# Patient Record
Sex: Female | Born: 1975 | Race: White | Hispanic: No | Marital: Single | Smoking: Current every day smoker
Health system: Southern US, Community
[De-identification: ages and names within clinical notes are randomized; demographics above are authoritative.]

## PROBLEM LIST (undated history)

## (undated) DIAGNOSIS — I10 Essential (primary) hypertension: Secondary | ICD-10-CM

## (undated) DIAGNOSIS — E119 Type 2 diabetes mellitus without complications: Secondary | ICD-10-CM

## (undated) DIAGNOSIS — E785 Hyperlipidemia, unspecified: Secondary | ICD-10-CM

## (undated) DIAGNOSIS — J45909 Unspecified asthma, uncomplicated: Secondary | ICD-10-CM

## (undated) HISTORY — PX: TUBAL LIGATION: SHX77

---

## 2008-11-20 ENCOUNTER — Ambulatory Visit: Payer: Self-pay | Admitting: Family Medicine

## 2009-01-01 ENCOUNTER — Observation Stay: Payer: Self-pay | Admitting: Unknown Physician Specialty

## 2009-01-04 ENCOUNTER — Inpatient Hospital Stay: Payer: Self-pay | Admitting: Obstetrics & Gynecology

## 2010-03-03 ENCOUNTER — Ambulatory Visit: Payer: Self-pay | Admitting: Internal Medicine

## 2010-05-09 ENCOUNTER — Ambulatory Visit: Payer: Self-pay | Admitting: Internal Medicine

## 2010-05-13 ENCOUNTER — Ambulatory Visit: Payer: Self-pay

## 2010-05-19 ENCOUNTER — Ambulatory Visit: Payer: Self-pay

## 2011-01-18 ENCOUNTER — Emergency Department: Payer: Self-pay | Admitting: Emergency Medicine

## 2012-01-30 ENCOUNTER — Ambulatory Visit: Payer: Self-pay | Admitting: Internal Medicine

## 2012-06-10 ENCOUNTER — Ambulatory Visit: Payer: Self-pay | Admitting: Family Medicine

## 2012-06-16 ENCOUNTER — Emergency Department: Payer: Self-pay | Admitting: Emergency Medicine

## 2012-06-20 LAB — WOUND CULTURE

## 2012-07-23 ENCOUNTER — Ambulatory Visit: Payer: Self-pay | Admitting: Family Medicine

## 2013-05-27 ENCOUNTER — Emergency Department: Payer: Self-pay | Admitting: Emergency Medicine

## 2013-05-27 LAB — COMPREHENSIVE METABOLIC PANEL
Albumin: 3.8 g/dL (ref 3.4–5.0)
BUN: 11 mg/dL (ref 7–18)
Bilirubin,Total: 0.4 mg/dL (ref 0.2–1.0)
Calcium, Total: 8.9 mg/dL (ref 8.5–10.1)
Co2: 27 mmol/L (ref 21–32)
Creatinine: 0.75 mg/dL (ref 0.60–1.30)
EGFR (African American): >60
EGFR (Non-African Amer.): >60
Glucose: 196 mg/dL — ABNORMAL HIGH (ref 65–99)
Potassium: 3.9 mmol/L (ref 3.5–5.1)
SGPT (ALT): 26 U/L (ref 12–78)

## 2013-05-27 LAB — CBC
MCH: 30.7 pg (ref 26.0–34.0)
MCHC: 33.4 g/dL (ref 32.0–36.0)
MCV: 92 fL (ref 80–100)
Platelet: 294 10*3/uL (ref 150–440)
RBC: 4.47 10*6/uL (ref 3.80–5.20)
RDW: 14.2 % (ref 11.5–14.5)

## 2013-05-27 LAB — URINALYSIS, COMPLETE
Bilirubin,UR: NEGATIVE
Blood: NEGATIVE
Glucose,UR: NEGATIVE mg/dL (ref 0–75)
Ketone: NEGATIVE
Nitrite: NEGATIVE
Ph: 5 (ref 4.5–8.0)
Protein: 30
RBC,UR: 2 /HPF (ref 0–5)

## 2013-05-28 LAB — URINE CULTURE

## 2014-03-23 ENCOUNTER — Ambulatory Visit: Payer: Self-pay | Admitting: Physician Assistant

## 2019-01-27 ENCOUNTER — Other Ambulatory Visit: Payer: Self-pay

## 2019-01-27 ENCOUNTER — Ambulatory Visit
Admission: EM | Admit: 2019-01-27 | Discharge: 2019-01-27 | Disposition: A | Discharge status: Home / Self Care | Payer: Medicaid Other | Attending: Family Medicine | Admitting: Family Medicine

## 2019-01-27 DIAGNOSIS — S161XXA Strain of muscle, fascia and tendon at neck level, initial encounter: Secondary | ICD-10-CM

## 2019-01-27 DIAGNOSIS — S29019A Strain of muscle and tendon of unspecified wall of thorax, initial encounter: Secondary | ICD-10-CM | POA: Diagnosis not present

## 2019-01-27 HISTORY — DX: Essential (primary) hypertension: I10

## 2019-01-27 HISTORY — DX: Hyperlipidemia, unspecified: E78.5

## 2019-01-27 HISTORY — DX: Unspecified asthma, uncomplicated: J45.909

## 2019-01-27 HISTORY — DX: Type 2 diabetes mellitus without complications: E11.9

## 2019-01-27 MED ORDER — MELOXICAM 15 MG PO TABS: 15.0000 mg | ORAL_TABLET | Freq: Every day | ORAL | 0 refills | Status: DC

## 2019-01-27 MED ORDER — CYCLOBENZAPRINE HCL 10 MG PO TABS: 10.0000 mg | ORAL_TABLET | Freq: Every day | ORAL | 0 refills | Status: AC

## 2019-01-27 NOTE — ED Triage Notes (Signed)
Patient complains that severe mid back pain and neck pain. Patient states that this started several weeks ago without a known injury.

## 2019-01-27 NOTE — ED Provider Notes (Signed)
[facility]    CSN: [id number] Arrival date & time: 01/27/19  1030     History   Chief Complaint Chief Complaint  Patient presents with  . Neck Pain  . Back Pain    HPI Brittany Mayer is a 43 y.o. female.   43 yo female with a c/o 2-3 weeks of neck and mid back pain. Denies any falls or injuries, fevers, chills, rash.   The history is provided by the patient.  Neck Pain  Back Pain    Past Medical History:  Diagnosis Date  . Asthma   . Diabetes ([place])   . Hyperlipidemia   . Hypertension     There are no active problems to display for this patient.   Past Surgical History:  Procedure Laterality Date  . TUBAL LIGATION      OB History   No obstetric history on file.      Home Medications    Prior to Admission medications   Medication Sig Start Date End Date Taking? Authorizing Provider  beclomethasone (QVAR) 80 MCG/ACT inhaler Inhale into the lungs. 03/30/17  Yes [provider]  cetirizine (ZYRTEC) 10 MG tablet Take 10 mg by mouth daily. 01/09/19  Yes [provider]  citalopram (CELEXA) 20 MG tablet TAKE 1 TABLET (20 MG TOTAL) BY MOUTH ONCE DAILY. 11/20/18  Yes [provider]  esomeprazole (NEXIUM) 20 MG capsule Take by mouth daily. 01/09/19  Yes [provider]  JARDIANCE 25 MG TABS tablet Take 25 mg by mouth daily. 12/09/18  Yes [provider]  LANTUS SOLOSTAR 100 UNIT/ML Solostar Pen INJECT 120 UNITS SUBCUTANEOUSLY NIGHTLY 01/09/19  Yes [provider]  LORazepam (ATIVAN) 0.5 MG tablet TAKE 1 TABLET ( 0.5 MG TOTAL) BY MOUTH EVERY 8 (EIGHT) HOURS AS NEEDED FOR ANXIETY 01/19/19  Yes [provider]  losartan (COZAAR) 100 MG tablet Take 100 mg by mouth daily. 11/07/18  Yes [provider]  metFORMIN (GLUCOPHAGE) 1000 MG tablet TAKE 1 TABLET (1,000 MG TOTAL) BY MOUTH 2 (TWO) TIMES DAILY WITH MEALS. 11/07/18  Yes [provider]  NOVOLOG FLEXPEN 100 UNIT/ML FlexPen INJECT 20  UNITS IN THE AM AND 40 UNITS IN THE PM 01/16/19  Yes [provider]  simvastatin (ZOCOR) 40 MG tablet TAKE 1 TABLET (40 MG TOTAL) BY MOUTH NIGHTLY. 11/20/18  Yes [provider]  SUMAtriptan (IMITREX) 50 MG tablet TAKE 1 TABLET (50 MG TOTAL) BY MOUTH ONCE AS NEEDED FOR MIGRAINE 01/09/19  Yes [provider]  traZODone (DESYREL) 50 MG tablet TAKE 1 TABLET BY MOUTH EVERY DAY AT NIGHT 01/09/19  Yes [provider]  VICTOZA 18 MG/3ML SOPN INJECT 1.8 MG UNDER THE SKIN ONCE DAILY 01/09/19  Yes [provider]  cyclobenzaprine (FLEXERIL) 10 MG tablet Take 1 tablet (10 mg total) by mouth at bedtime. 01/27/19   Norval Gable, MD  meloxicam (MOBIC) 15 MG tablet Take 1 tablet (15 mg total) by mouth daily. 01/27/19   Norval Gable, MD    Family History Family History  Problem Relation Age of Onset  . Hypertension Mother   . Hyperlipidemia Mother   . Asthma Mother   . Heart disease Father     Social History Social History   Tobacco Use  . Smoking status: Current Every Day Smoker    Packs/day: 1.00    Types: Cigarettes  . Smokeless tobacco: Never Used  Substance Use Topics  . Alcohol use: Not Currently  . Drug use: Never  Allergies   Patient has no known allergies.   Review of Systems Review of Systems  Musculoskeletal: Positive for back pain and neck pain.     Physical Exam Triage Vital Signs ED Triage Vitals  Enc Vitals Group     BP 01/27/19 1103 (!) 109/57     Pulse Rate 01/27/19 1103 93     Resp 01/27/19 1103 18     Temp 01/27/19 1103 98.7 F (37.1 C)     Temp Source 01/27/19 1103 Oral     SpO2 01/27/19 1103 96 %     Weight 01/27/19 1058 300 lb (136.1 kg)     Height 01/27/19 1058 5' 5"  (1.651 m)     Head Circumference --      Peak Flow --      Pain Score 01/27/19 1058 8     Pain Loc --      Pain Edu? --      Excl. in [place]? --    No data found.  Updated Vital Signs BP (!) 109/57 (BP Location: Right Arm)   Pulse 93   Temp  98.7 F (37.1 C) (Oral)   Resp 18   Ht 5' 5"  (1.651 m)   Wt 136.1 kg   LMP 01/23/2019   SpO2 96%   BMI 49.92 kg/m   Visual Acuity Right Eye Distance:   Left Eye Distance:   Bilateral Distance:    Right Eye Near:   Left Eye Near:    Bilateral Near:     Physical Exam Vitals signs and nursing note reviewed.  Constitutional:      General: She is not in acute distress.    Appearance: She is well-developed. She is not diaphoretic.  HENT:     Head: Normocephalic and atraumatic.  Eyes:     Pupils: Pupils are equal, round, and reactive to light.  Neck:     Musculoskeletal: Normal range of motion and neck supple.     Thyroid: No thyromegaly.     Trachea: No tracheal deviation.  Musculoskeletal:     Cervical back: She exhibits tenderness (paraspinous muscles) and spasm. She exhibits normal range of motion, no bony tenderness, no swelling, no edema, no deformity, no laceration and normal pulse.     Thoracic back: She exhibits tenderness (paraspinous muscles) and spasm. She exhibits normal range of motion and no bony tenderness.  Lymphadenopathy:     Cervical: No cervical adenopathy.  Neurological:     Mental Status: She is alert and oriented to person, place, and time.     Cranial Nerves: No cranial nerve deficit.     Motor: No abnormal muscle tone.     Coordination: Coordination normal.     Deep Tendon Reflexes: Reflexes are normal and symmetric.      UC Treatments / Results  Labs (all labs ordered are listed, but only abnormal results are displayed) Labs Reviewed - No data to display  EKG None  Radiology No results found.  Procedures Procedures (including critical care time)  Medications Ordered in UC Medications - No data to display  Initial Impression / Assessment and Plan / UC Course  I have reviewed the triage vital signs and the nursing notes.  Pertinent labs & imaging results that were available during my care of the patient were reviewed by me and  considered in my medical decision making (see chart for details).      Final Clinical Impressions(s) / UC Diagnoses   Final diagnoses:  Strain of neck  muscle, initial encounter  Thoracic myofascial strain, initial encounter     Discharge Instructions     Heat, stretch    ED Prescriptions    Medication Sig Dispense Auth. Provider   cyclobenzaprine (FLEXERIL) 10 MG tablet Take 1 tablet (10 mg total) by mouth at bedtime. 30 tablet Norval Gable, MD   meloxicam (MOBIC) 15 MG tablet Take 1 tablet (15 mg total) by mouth daily. 30 tablet Norval Gable, MD     1. diagnosis reviewed with patient  2. rx as per orders above; reviewed possible side effects, interactions, risks and benefits  3. Recommend supportive treatment as above 4. Follow-up prn if symptoms worsen or don't improve Controlled Substance Prescriptions [place] Controlled Substance Registry consulted? Not Applicable   Norval Gable, MD 01/27/19 1139

## 2019-01-27 NOTE — Discharge Instructions (Signed)
Heat, stretch

## 2019-07-04 ENCOUNTER — Other Ambulatory Visit: Payer: Self-pay | Admitting: Orthopedic Surgery

## 2019-07-04 DIAGNOSIS — M5136 Other intervertebral disc degeneration, lumbar region: Secondary | ICD-10-CM

## 2019-07-04 DIAGNOSIS — G8929 Other chronic pain: Secondary | ICD-10-CM

## 2019-07-04 DIAGNOSIS — M5441 Lumbago with sciatica, right side: Secondary | ICD-10-CM

## 2019-07-15 ENCOUNTER — Ambulatory Visit
Admission: RE | Admit: 2019-07-15 | Discharge: 2019-07-15 | Disposition: A | Discharge status: Home / Self Care | Payer: Medicaid Other | Source: Ambulatory Visit | Attending: Orthopedic Surgery | Admitting: Orthopedic Surgery

## 2019-07-15 ENCOUNTER — Other Ambulatory Visit: Payer: Self-pay

## 2019-07-15 DIAGNOSIS — M5441 Lumbago with sciatica, right side: Secondary | ICD-10-CM | POA: Insufficient documentation

## 2019-07-15 DIAGNOSIS — M5442 Lumbago with sciatica, left side: Secondary | ICD-10-CM | POA: Insufficient documentation

## 2019-07-15 DIAGNOSIS — G8929 Other chronic pain: Secondary | ICD-10-CM | POA: Diagnosis present

## 2019-07-15 DIAGNOSIS — M5136 Other intervertebral disc degeneration, lumbar region: Secondary | ICD-10-CM

## 2019-07-31 ENCOUNTER — Other Ambulatory Visit: Payer: Self-pay | Admitting: Orthopedic Surgery

## 2019-07-31 DIAGNOSIS — M542 Cervicalgia: Secondary | ICD-10-CM

## 2019-07-31 DIAGNOSIS — M5412 Radiculopathy, cervical region: Secondary | ICD-10-CM

## 2019-07-31 DIAGNOSIS — M4802 Spinal stenosis, cervical region: Secondary | ICD-10-CM

## 2019-08-11 ENCOUNTER — Ambulatory Visit
Admission: RE | Admit: 2019-08-11 | Discharge: 2019-08-11 | Disposition: A | Discharge status: Home / Self Care | Payer: Medicaid Other | Source: Ambulatory Visit | Attending: Orthopedic Surgery | Admitting: Orthopedic Surgery

## 2019-08-11 ENCOUNTER — Other Ambulatory Visit: Payer: Self-pay

## 2019-08-11 DIAGNOSIS — M542 Cervicalgia: Secondary | ICD-10-CM | POA: Diagnosis not present

## 2019-08-11 DIAGNOSIS — M4802 Spinal stenosis, cervical region: Secondary | ICD-10-CM

## 2019-08-11 DIAGNOSIS — M5412 Radiculopathy, cervical region: Secondary | ICD-10-CM | POA: Insufficient documentation

## 2019-08-12 ENCOUNTER — Ambulatory Visit: Payer: Medicaid Other

## 2020-06-02 ENCOUNTER — Other Ambulatory Visit: Payer: Self-pay | Admitting: Physical Medicine & Rehabilitation

## 2020-06-02 DIAGNOSIS — M546 Pain in thoracic spine: Secondary | ICD-10-CM

## 2020-06-17 ENCOUNTER — Ambulatory Visit
Admission: RE | Admit: 2020-06-17 | Discharge: 2020-06-17 | Disposition: A | Discharge status: Home / Self Care | Payer: Medicaid Other | Source: Ambulatory Visit | Attending: Physical Medicine & Rehabilitation | Admitting: Physical Medicine & Rehabilitation

## 2020-06-17 ENCOUNTER — Other Ambulatory Visit: Payer: Self-pay

## 2020-06-17 DIAGNOSIS — M546 Pain in thoracic spine: Secondary | ICD-10-CM | POA: Diagnosis present

## 2021-02-10 ENCOUNTER — Other Ambulatory Visit: Payer: Self-pay

## 2021-02-10 ENCOUNTER — Ambulatory Visit
Payer: Medicaid Other | Attending: Student in an Organized Health Care Education/Training Program | Admitting: Student in an Organized Health Care Education/Training Program

## 2021-02-10 ENCOUNTER — Encounter: Payer: Self-pay | Admitting: Student in an Organized Health Care Education/Training Program

## 2021-02-10 VITALS — BP 122/77 | HR 98 | Temp 97.2°F | Resp 16 | Ht 65.0 in | Wt 329.3 lb

## 2021-02-10 DIAGNOSIS — M5136 Other intervertebral disc degeneration, lumbar region: Secondary | ICD-10-CM | POA: Diagnosis not present

## 2021-02-10 DIAGNOSIS — M47816 Spondylosis without myelopathy or radiculopathy, lumbar region: Secondary | ICD-10-CM | POA: Diagnosis present

## 2021-02-10 NOTE — Progress Notes (Signed)
Safety precautions to be maintained throughout the outpatient stay will include: orient to surroundings, keep bed in low position, maintain call bell within reach at all times, provide assistance with transfer out of bed and ambulation.  

## 2021-02-10 NOTE — Progress Notes (Signed)
Patient: Brittany Mayer  Service Category: E/M  Provider: Gillis Santa, MD  DOB: 03-17-1976  DOS: 02/10/2021  Referring Provider: [facility]  MRN: [id number]  Setting: Ambulatory outpatient  PCP: [facility]  Type: New Patient  Specialty: Interventional Pain Management    Location: Office  Delivery: Face-to-face     Primary Reason(s) for Visit: Encounter for initial evaluation of one or more chronic problems (new to examiner) potentially causing chronic pain, and posing a threat to normal musculoskeletal function. (Level of risk: High) CC: Back Pain (Low, mid), Neck Pain, and Foot Pain (Bilat, numbness)  HPI  Brittany Mayer is a 45 y.o. year old, female patient, who comes for the first time to our practice referred by [place] for our initial evaluation of her chronic pain. She has Morbid obesity ([place]); Lumbar degenerative disc disease; and Lumbar facet arthropathy on their problem list. Today she comes in for evaluation of her Back Pain (Low, mid), Neck Pain, and Foot Pain (Bilat, numbness)  Pain Assessment: Location: Lower,Mid Back Radiating: through hips to knees Onset: More than a month ago Duration: Chronic pain Quality: Numbness,Burning Severity: 8 /10 (subjective, self-reported pain score)  Effect on ADL: limits daily activities, no longer able to work Timing: Constant Modifying factors: heating pad, sitting, hot bath BP: 122/77  HR: 98  Onset and Duration: Sudden and Gradual Cause of pain: Unknown Severity: Getting worse, NAS-11 at its worse: 10/10, NAS-11 at its best: 8/10, NAS-11 now: 8/10 and NAS-11 on the average: 8/10 Timing: Night, Not influenced by the time of the day, During activity or exercise, After activity or exercise and After a period of immobility Aggravating Factors: Bending, Lifiting, Motion, Nerve blocks, Prolonged sitting, Prolonged standing, Squatting, Stooping , Walking, Walking uphill, Walking downhill and  Working Alleviating Factors: Hot packs, Lying down, Medications, Resting, Sitting, Sleeping and Warm showers or baths Associated Problems: Day-time cramps, Night-time cramps, Depression, Fatigue, Inability to concentrate, Numbness, Sadness, Spasms, Sweating, Tingling, Weakness, Pain that wakes patient up and Pain that does not allow patient to sleep Quality of Pain: Aching, Agonizing, Annoying, Burning, Constant, Intermittent, Cramping, Cruel, Deep, Disabling, Distressing, Dreadful, Exhausting, Horrible, Pulsating, Punishing, Sharp, Shooting, Stabbing, Tingling, Tiring and Uncomfortable Previous Examinations or Tests: MRI scan, Nerve block and X-rays Previous Treatments: Epidural steroid injections, Physical Therapy, Stretching exercises and Traction  Brittany Mayer is a pleasant 45 year old female who presents with a chief complaint of thoracic and lumbar spine pain that occasionally radiates into her posterior thighs.  She is being referred by Dr. Alba Destine for consideration of spinal cord stimulation.  Of note she has undergone bilateral S1 transforaminal ESI on multiple occasions with limited response.  She is status post bilateral T8-T9 transforaminal ESI with 25% improvement as well as lumbar RFA with no response.    Meds   Current Outpatient Medications:  .  beclomethasone (QVAR) 80 MCG/ACT inhaler, Inhale into the lungs., Disp: , Rfl:  .  cetirizine (ZYRTEC) 10 MG tablet, Take 10 mg by mouth daily., Disp: , Rfl:  .  citalopram (CELEXA) 20 MG tablet, TAKE 1 TABLET (20 MG TOTAL) BY MOUTH ONCE DAILY., Disp: , Rfl:  .  cyclobenzaprine (FLEXERIL) 10 MG tablet, Take 1 tablet (10 mg total) by mouth at bedtime., Disp: 30 tablet, Rfl: 0 .  esomeprazole (NEXIUM) 20 MG capsule, Take by mouth daily., Disp: , Rfl:  .  gabapentin (NEURONTIN) 300 MG capsule, Take 300 mg by mouth 3 (three) times daily., Disp: , Rfl:  .  JARDIANCE 25 MG TABS tablet, Take 25 mg by mouth daily., Disp: , Rfl:  .  LANTUS SOLOSTAR 100  UNIT/ML Solostar Pen, INJECT 120 UNITS SUBCUTANEOUSLY NIGHTLY, Disp: , Rfl:  .  LORazepam (ATIVAN) 0.5 MG tablet, TAKE 1 TABLET ( 0.5 MG TOTAL) BY MOUTH EVERY 8 (EIGHT) HOURS AS NEEDED FOR ANXIETY, Disp: , Rfl:  .  losartan (COZAAR) 100 MG tablet, Take 100 mg by mouth daily., Disp: , Rfl:  .  metFORMIN (GLUCOPHAGE) 1000 MG tablet, TAKE 1 TABLET (1,000 MG TOTAL) BY MOUTH 2 (TWO) TIMES DAILY WITH MEALS., Disp: , Rfl:  .  simvastatin (ZOCOR) 40 MG tablet, TAKE 1 TABLET (40 MG TOTAL) BY MOUTH NIGHTLY., Disp: , Rfl:  .  SUMAtriptan (IMITREX) 50 MG tablet, TAKE 1 TABLET (50 MG TOTAL) BY MOUTH ONCE AS NEEDED FOR MIGRAINE, Disp: , Rfl:  .  traMADol (ULTRAM) 50 MG tablet, Take by mouth every 6 (six) hours as needed., Disp: , Rfl:  .  traZODone (DESYREL) 50 MG tablet, TAKE 1 TABLET BY MOUTH EVERY DAY AT NIGHT, Disp: , Rfl:  .  VICTOZA 18 MG/3ML SOPN, INJECT 1.8 MG UNDER THE SKIN ONCE DAILY, Disp: , Rfl:  .  meloxicam (MOBIC) 15 MG tablet, Take 1 tablet (15 mg total) by mouth daily. (Patient not taking: Reported on 02/10/2021), Disp: 30 tablet, Rfl: 0 .  NOVOLOG FLEXPEN 100 UNIT/ML FlexPen, INJECT 20 UNITS IN THE AM AND 40 UNITS IN THE PM (Patient not taking: Reported on 02/10/2021), Disp: , Rfl:   Imaging Review  Cervical Imaging: Cervical MR wo contrast: Results for orders placed during the hospital encounter of 08/11/19  MR CERVICAL SPINE WO CONTRAST  Narrative CLINICAL DATA:  Back pain and left arm pain.  EXAM: MRI CERVICAL SPINE WITHOUT CONTRAST  TECHNIQUE: Multiplanar, multisequence MR imaging of the cervical spine was performed. No intravenous contrast was administered.  COMPARISON:  None.  FINDINGS: Alignment: Physiologic.  Vertebrae: No fracture, evidence of discitis, or bone lesion.  Cord: Normal signal and morphology.  Posterior Fossa, vertebral arteries, paraspinal tissues: Negative.  Disc levels:  C2-3: Normal.  C3-4: Normal.  C4-5: Tiny central disc bulge without neural  impingement.  C5-6: Small disc bulge with accompanying osteophytes extending the into the left lateral recess without neural impingement. No foraminal stenosis.  C6-7: Tiny central disc bulge slightly asymmetric to the right with no neural impingement. Widely patent neural foramina.  C7-T1: Normal.  IMPRESSION: No significant abnormality of the cervical spine.   Electronically Signed By: Lorriane Shire M.D. On: 08/11/2019 08:33  Thoracic Imaging: Thoracic MR wo contrast: Results for orders placed during the hospital encounter of 06/17/20  MR THORACIC SPINE WO CONTRAST  Narrative CLINICAL DATA:  Upper back pain with bilateral arm pain.  EXAM: MRI THORACIC SPINE WITHOUT CONTRAST  TECHNIQUE: Multiplanar, multisequence MR imaging of the thoracic spine was performed. No intravenous contrast was administered.  COMPARISON:  None.  FINDINGS: Alignment:  Normal  Vertebrae: Negative for fracture or mass.  No bone marrow edema.  Cord:  Normal signal and morphology.  No cord compression.  Paraspinal and other soft tissues: Negative  Disc levels:  Mild thoracic disc and facet degeneration. This is causing foraminal narrowing bilaterally at T7-8 and T8-9. Small left-sided disc protrusion T7-8.  No significant spinal stenosis. Small right-sided disc protrusion T12-L1 without stenosis.  IMPRESSION: Mild thoracic degenerative changes. There is foraminal narrowing bilaterally at T7-8 and T8-9 due to disc and facet degeneration. There is a left-sided disc protrusion at  T7-8   Electronically Signed By: Franchot Gallo M.D. On: 06/17/2020 09:27  MR LUMBAR SPINE WO CONTRAST  Narrative CLINICAL DATA:  Low back pain with bilateral leg numbness  EXAM: MRI LUMBAR SPINE WITHOUT CONTRAST  TECHNIQUE: Multiplanar, multisequence MR imaging of the lumbar spine was performed. No intravenous contrast was administered.  COMPARISON:  None.  FINDINGS: Segmentation:   Normal  Alignment:  Normal  Vertebrae:  Normal bone marrow.  Negative for fracture or mass.  Conus medullaris and cauda equina: Conus extends to the L1-2 level. Conus and cauda equina appear normal.  Paraspinal and other soft tissues: Negative for paraspinous mass or adenopathy. No soft tissue edema or fluid collection  Disc levels:  L1-2: Negative  L2-3: Mild facet degeneration asymmetric on the left. No significant spinal or foraminal stenosis. Slight disc bulging.  L3-4: Normal disc space with mild facet degeneration. Negative for stenosis.  L4-5: Moderate facet degeneration bilaterally. No disc protrusion or stenosis  L5-S1: Bilateral facet hypertrophy without significant stenosis. Small right-sided disc protrusion with mild subarticular stenosis on the right but no nerve root compression.  IMPRESSION: Lumbar facet degeneration.  Small right-sided disc protrusion L5-S1 without neural compression.   Electronically Signed By: Franchot Gallo M.D. On: 07/16/2019 10:26   Complexity Note: Imaging results reviewed. Results shared with Ms. Batley, using Layman's terms.                         ROS  Cardiovascular: High blood pressure Pulmonary or Respiratory: Wheezing and difficulty taking a deep full breath (Asthma), Smoking, Coughing up mucus (Bronchitis) and Temporary stoppage of breathing during sleep Neurological: No reported neurological signs or symptoms such as seizures, abnormal skin sensations, urinary and/or fecal incontinence, being born with an abnormal open spine and/or a tethered spinal cord Psychological-Psychiatric: Anxiousness, Depressed and Prone to panicking Gastrointestinal: Reflux or heatburn Genitourinary: No reported renal or genitourinary signs or symptoms such as difficulty voiding or producing urine, peeing blood, non-functioning kidney, kidney stones, difficulty emptying the bladder, difficulty controlling the flow of urine, or chronic kidney  disease Hematological: No reported hematological signs or symptoms such as prolonged bleeding, low or poor functioning platelets, bruising or bleeding easily, hereditary bleeding problems, low energy levels due to low hemoglobin or being anemic Endocrine: 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 Reddoch has No Known Allergies.  Laboratory Chemistry Profile   Renal Lab Results  Component Value Date   BUN 11 05/27/2013   CREATININE 0.75 05/27/2013   GFRAA >60 05/27/2013   GFRNONAA >60 05/27/2013   PROTEINUR 30 mg/dL 05/27/2013     Electrolytes Lab Results  Component Value Date   NA 139 05/27/2013   K 3.9 05/27/2013   CL 105 05/27/2013   CALCIUM 8.9 05/27/2013     Hepatic Lab Results  Component Value Date   AST 20 05/27/2013   ALT 26 05/27/2013   ALBUMIN 3.8 05/27/2013   ALKPHOS 103 05/27/2013     ID No results found for: LYMEIGGIGMAB, HIV, SARSCOV2NAA, STAPHAUREUS, MRSAPCR, HCVAB, PREGTESTUR, RMSFIGG, QFVRPH1IGG, QFVRPH2IGG, LYMEIGGIGMAB   Bone No results found for: [place], [id number], [id number], [id number], 25OHVITD1, 25OHVITD2, 25OHVITD3, TESTOFREE, TESTOSTERONE   Endocrine Lab Results  Component Value  Date   GLUCOSE 196 (H) 05/27/2013   GLUCOSEU Negative 05/27/2013     Neuropathy No results found for: VITAMINB12, FOLATE, HGBA1C, HIV   CNS No results found for: COLORCSF, APPEARCSF, RBCCOUNTCSF, WBCCSF, POLYSCSF, LYMPHSCSF, EOSCSF, PROTEINCSF, GLUCCSF, JCVIRUS, CSFOLI, IGGCSF, LABACHR, ACETBL, LABACHR, ACETBL   Inflammation (CRP: Acute  ESR: Chronic) No results found for: CRP, ESRSEDRATE,  LATICACIDVEN   Rheumatology No results found for: RF, ANA, LABURIC, URICUR, LYMEIGGIGMAB, LYMEABIGMQN, HLAB27   Coagulation Lab Results  Component Value Date   PLT 294 05/27/2013     Cardiovascular Lab Results  Component Value Date   HGB 13.7 05/27/2013   HCT 41.0 05/27/2013     Screening No results found for: SARSCOV2NAA, COVIDSOURCE, STAPHAUREUS, MRSAPCR, HCVAB, HIV, PREGTESTUR   Cancer No results found for: CEA, CA125, LABCA2   Allergens No results found for: ALMOND, APPLE, ASPARAGUS, AVOCADO, BANANA, BARLEY, BASIL, B[place]LE[place], GREENBEAN, LIMABEAN, WHITEBEAN, BEEFIGE, REDBEET, BLUEBERRY, BROCCOLI, CABBAGE, MELON, CARROT, CASEIN, CASHEWNUT, CAULIFLOWER, CELERY     Note: Lab results reviewed.  [place]  Drug: Ms. Rayle  reports no history of drug use. Alcohol:  reports previous alcohol use. Tobacco:  reports that she has been smoking cigarettes. She has been smoking about 1.00 pack per day. She has never used smokeless tobacco. Medical:  has a past medical history of Asthma, Diabetes ([place]), Hyperlipidemia, and Hypertension. Family: family history includes Asthma in her mother; Heart disease in her father; Hyperlipidemia in her mother; Hypertension in her mother.  Past Surgical History:  Procedure Laterality Date  . TUBAL LIGATION     Active Ambulatory Problems    Diagnosis Date Noted  . Morbid obesity ([place]) 02/10/2021  . Lumbar degenerative disc disease 02/10/2021  . Lumbar facet arthropathy 02/10/2021   Resolved Ambulatory Problems    Diagnosis Date Noted  . No Resolved Ambulatory Problems   Past Medical History:  Diagnosis Date  . Asthma   . Diabetes ([place])   . Hyperlipidemia   . Hypertension    Constitutional Exam  General appearance: alert, cooperative and morbidly obese Vitals:   02/10/21 1000  BP: 122/77  Pulse: 98  Resp: 16  Temp: (!) 97.2 F (36.2 C)  TempSrc: Temporal  SpO2: 95%  Weight: (!) 329 lb 4.8 oz (149.4 kg)  Height: 5' 5"  (1.651 m)    BMI Assessment: Estimated body mass index is 54.8 kg/m as calculated from the following:   Height as of this encounter: 5' 5"  (1.651 m).   Weight as of this encounter: 329 lb 4.8 oz (149.4 kg).  BMI interpretation table: BMI level Category Range association with higher incidence of chronic pain  <18 kg/m2 Underweight   18.5-24.9 kg/m2 Ideal body weight   25-29.9 kg/m2 Overweight Increased incidence by 20%  30-34.9 kg/m2 Obese (Class I) Increased incidence by 68%  35-39.9 kg/m2 Severe obesity (Class II) Increased incidence by 136%  >40 kg/m2 Extreme obesity (Class III) Increased incidence by 254%   Patient's current BMI Ideal Body weight  Body mass index is 54.8 kg/m. Ideal body weight: 57 kg (125 lb 10.6 oz) Adjusted ideal body weight: 93.9 kg (207 lb 1.9 oz)   BMI Readings from Last 4 Encounters:  02/10/21 54.80 kg/m  01/27/19 49.92 kg/m   Wt Readings from Last 4 Encounters:  02/10/21 (!) 329 lb 4.8 oz (149.4 kg)  01/27/19 300 lb (136.1 kg)    Psych/Mental status: Alert, oriented x 3 (person, place, & time)       Eyes: PERLA Respiratory: No evidence of  acute respiratory distress  Cervical Spine Exam  Skin & Axial Inspection: No masses, redness, edema, swelling, or associated skin lesions Alignment: Symmetrical Functional ROM: Unrestricted ROM      Stability: No instability detected Muscle Tone/Strength: Functionally intact. No obvious neuro-muscular anomalies detected. Sensory (Neurological): Musculoskeletal pain pattern Palpation: No palpable anomalies              Upper Extremity (UE) Exam    Side: Right upper extremity  Side: Left upper extremity  Skin & Extremity Inspection: Skin color, temperature, and hair growth are WNL. No peripheral edema or cyanosis. No masses, redness, swelling, asymmetry, or associated skin lesions. No contractures.  Skin & Extremity Inspection: Skin color, temperature, and hair growth are WNL. No peripheral edema or cyanosis. No masses,  redness, swelling, asymmetry, or associated skin lesions. No contractures.  Functional ROM: Unrestricted ROM          Functional ROM: Unrestricted ROM          Muscle Tone/Strength: Functionally intact. No obvious neuro-muscular anomalies detected.   Muscle Tone/Strength: Functionally intact. No obvious neuro-muscular anomalies detected.  Sensory (Neurological): Unimpaired          Sensory (Neurological): Unimpaired          Palpation: No palpable anomalies              Palpation: No palpable anomalies              Provocative Test(s):  Phalen's test: deferred Tinel's test: deferred Apley's scratch test (touch opposite shoulder):  Action 1 (Across chest): deferred Action 2 (Overhead): deferred Action 3 (LB reach): deferred   Provocative Test(s):  Phalen's test: deferred Tinel's test: deferred Apley's scratch test (touch opposite shoulder):  Action 1 (Across chest): deferred Action 2 (Overhead): deferred Action 3 (LB reach): deferred    Thoracic Spine Area Exam  Skin & Axial Inspection: No masses, redness, or swelling Alignment: Symmetrical Functional ROM: Unrestricted ROM Stability: No instability detected Muscle Tone/Strength: Functionally intact. No obvious neuro-muscular anomalies detected. Sensory (Neurological): Dermatomal pain pattern  Lumbar Exam  Skin & Axial Inspection: No masses, redness, or swelling Alignment: Symmetrical Functional ROM: Pain restricted ROM affecting both sides Stability: No instability detected Muscle Tone/Strength: Functionally intact. No obvious neuro-muscular anomalies detected. Sensory (Neurological): Dermatomal pain pattern and musculoskeletal Palpation: No palpable anomalies       Provocative Tests: Hyperextension/rotation test: (+) bilaterally for facet joint pain. Lumbar quadrant test (Kemp's test): (+) bilaterally for facet joint pain.   Gait & Posture Assessment  Ambulation: Unassisted Gait: Modified gait pattern (slower gait speed,  wider stride width, and longer stance duration) associated with morbid obesity Posture: WNL   Lower Extremity Exam    Side: Right lower extremity  Side: Left lower extremity  Stability: No instability observed          Stability: No instability observed          Skin & Extremity Inspection: Skin color, temperature, and hair growth are WNL. No peripheral edema or cyanosis. No masses, redness, swelling, asymmetry, or associated skin lesions. No contractures.  Skin & Extremity Inspection: Skin color, temperature, and hair growth are WNL. No peripheral edema or cyanosis. No masses, redness, swelling, asymmetry, or associated skin lesions. No contractures.  Functional ROM: Unrestricted ROM                  Functional ROM: Unrestricted ROM                  Muscle  Tone/Strength: Functionally intact. No obvious neuro-muscular anomalies detected.  Muscle Tone/Strength: Functionally intact. No obvious neuro-muscular anomalies detected.  Sensory (Neurological): Unimpaired        Sensory (Neurological): Unimpaired        DTR: Patellar: deferred today Achilles: deferred today Plantar: deferred today  DTR: Patellar: deferred today Achilles: deferred today Plantar: deferred today  Palpation: No palpable anomalies  Palpation: No palpable anomalies   Assessment  Primary Diagnosis & Pertinent Problem List: The primary encounter diagnosis was Lumbar facet arthropathy. Diagnoses of Lumbar spondylosis, Lumbar degenerative disc disease, and Morbid obesity ([place]) were also pertinent to this visit.  Visit Diagnosis (New problems to examiner): 1. Lumbar facet arthropathy   2. Lumbar spondylosis   3. Lumbar degenerative disc disease   4. Morbid obesity ([place])    Plan of Care (Initial workup plan)    I had an extensive discussion with the patient regarding peripheral nerve stimulation of lumbar medial branch utilizing the Sprint system.  Risks and benefits of this procedure were discussed in great detail.   Patient will need to reduce her weight to less than 300 pounds before she can be considered a candidate for a peripheral nerve stimulation therapy.  I provided her resources for Sprint peripheral nerve stimulation.  Patient can follow-up as needed.  Interventional management options: Ms. Yetman was informed that there is no guarantee that she would be a candidate for interventional therapies. The decision will be based on the results of diagnostic studies, as well as Ms. Noyce's risk profile.  Procedure(s) under consideration:  L4 medial branch peripheral nerve stimulation   Provider-requested follow-up: Return if symptoms worsen or fail to improve.  No future appointments.  Note by: Gillis Santa, MD Date: 02/10/2021; Time: 10:47 AM

## 2021-03-27 IMAGING — MR MR THORACIC SPINE W/O CM
6 series · 30 of 48 positions shown · non-contrast
Comparison: None.

CLINICAL DATA: Upper back pain with bilateral arm pain.

EXAM:
MRI THORACIC SPINE WITHOUT CONTRAST
TECHNIQUE: Multiplanar, multisequence MR imaging of the thoracic spine was
performed. No intravenous contrast was administered.

[Series 16: T1 · sagittal · 5.0mm · 1.41mm/px · 2 of 9 slices shown (1 of 2)]
[im 1/9]
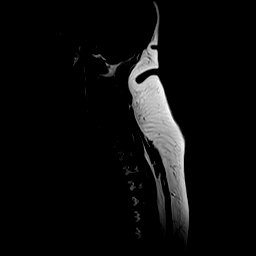
[im 9/9]
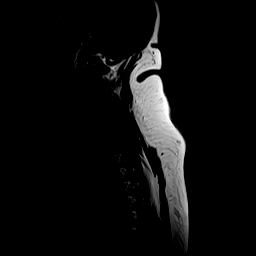

[Series 17: T2 · sagittal · 3.0mm · 1.06mm/px · 6 of 17 slices shown (1 of 2)]
[im 1/17]
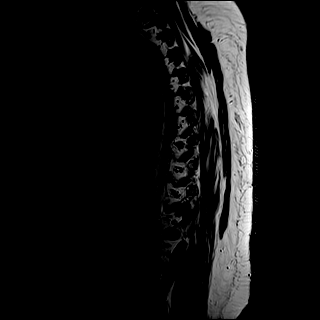
[im 4/17]
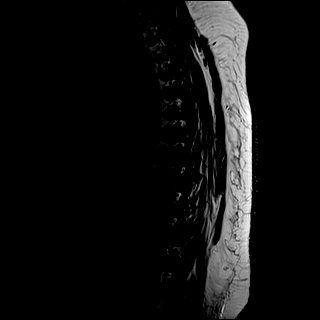
[im 7/17]
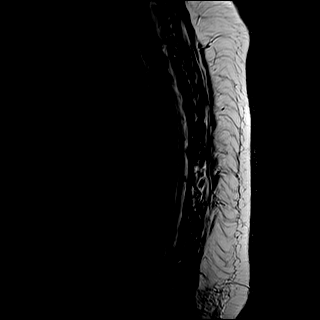
[im 10/17]
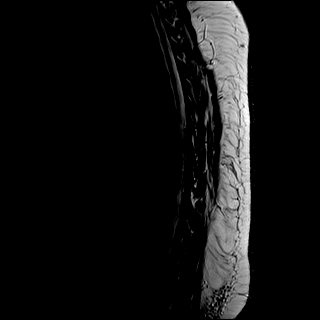
[im 13/17]
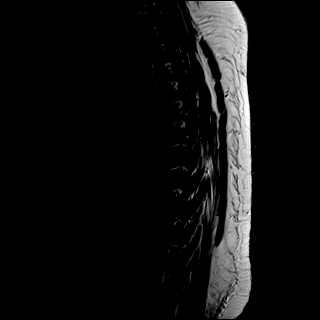
[im 17/17]
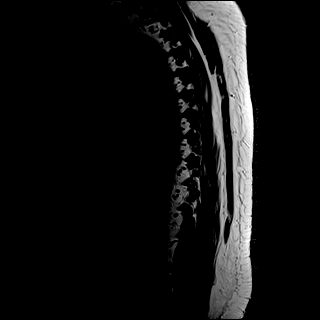

[Series 18: T1 · sagittal · 3.0mm · 1.06mm/px · 6 of 17 slices shown (2 of 2)]
[im 1/17]
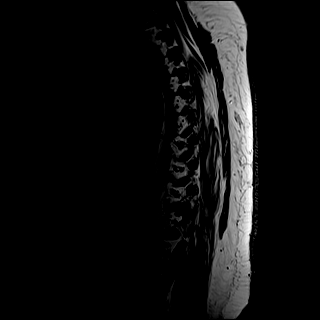
[im 4/17]
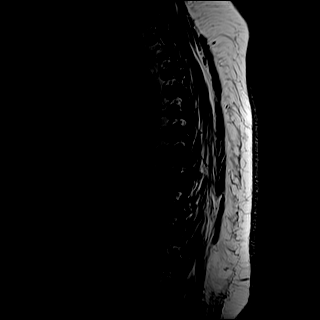
[im 7/17]
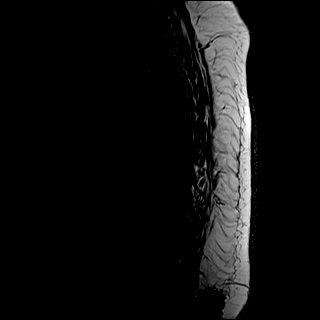
[im 10/17]
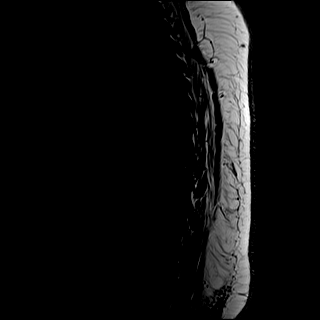
[im 13/17]
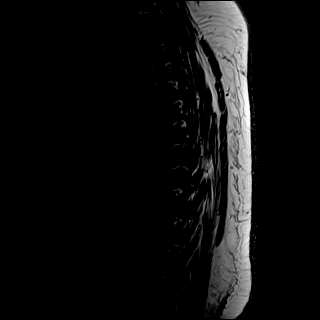
[im 17/17]
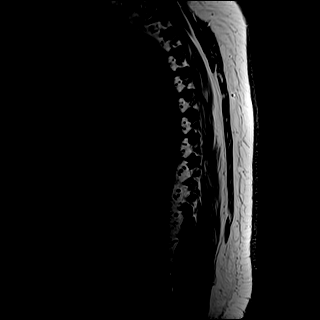

[Series 19: STIR · sagittal · 3.0mm · 0.53mm/px · 6 of 17 slices shown]
[im 1/17]
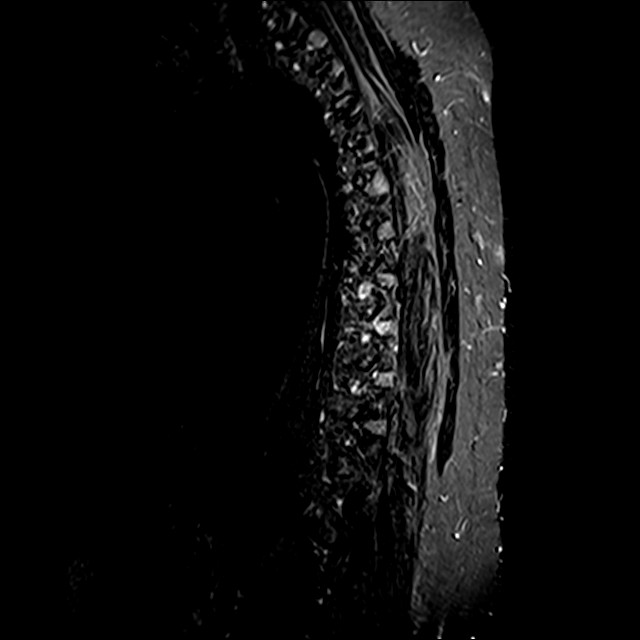
[im 4/17]
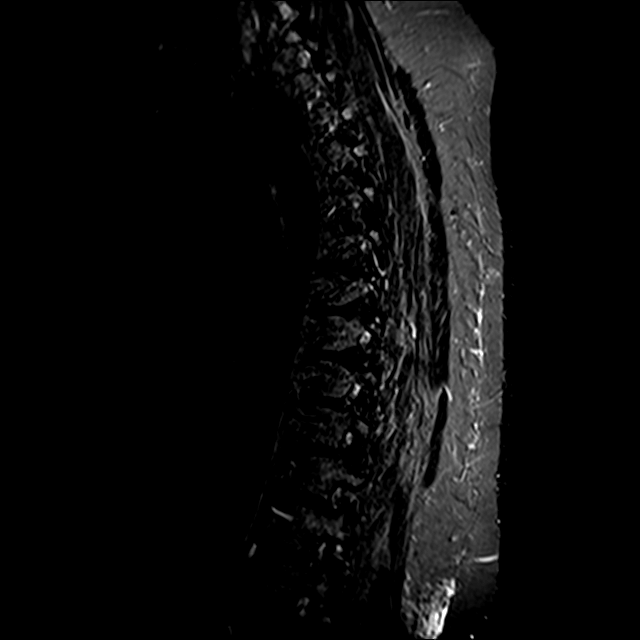
[im 7/17]
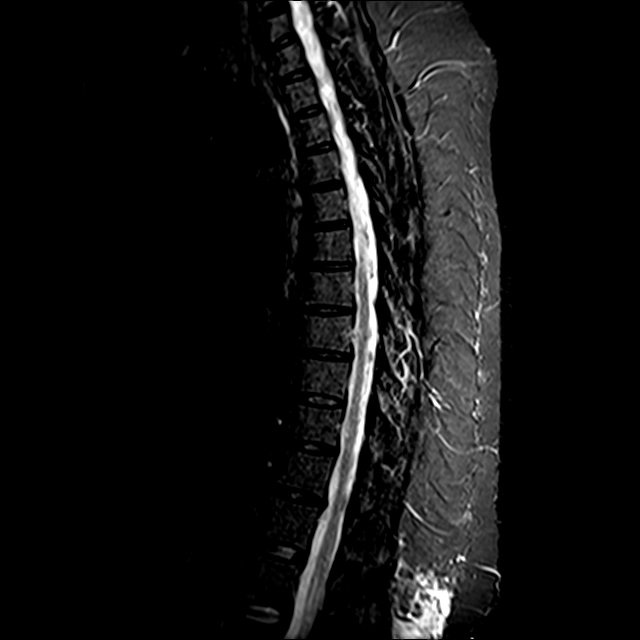
[im 10/17]
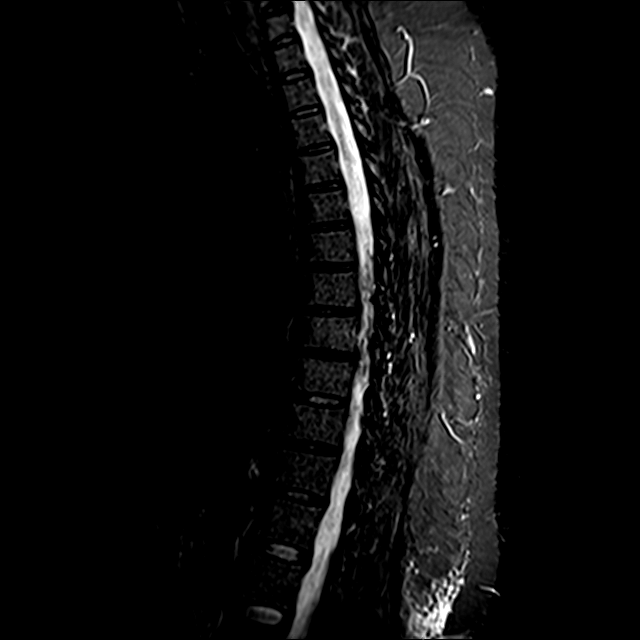
[im 13/17]
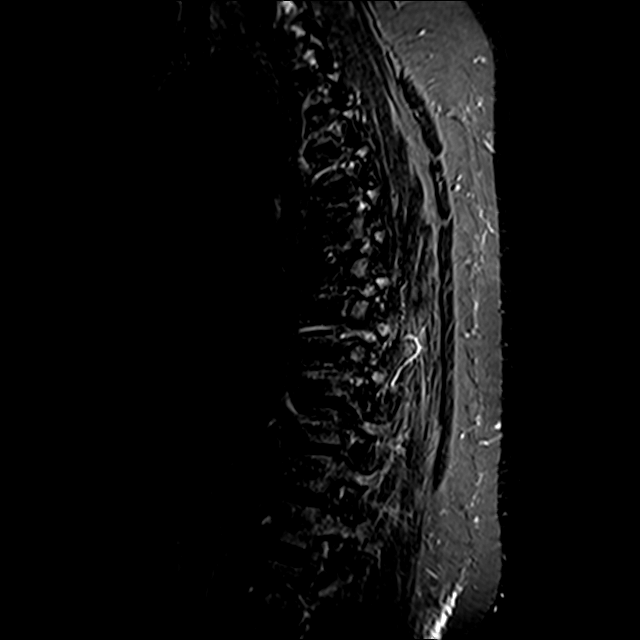
[im 17/17]
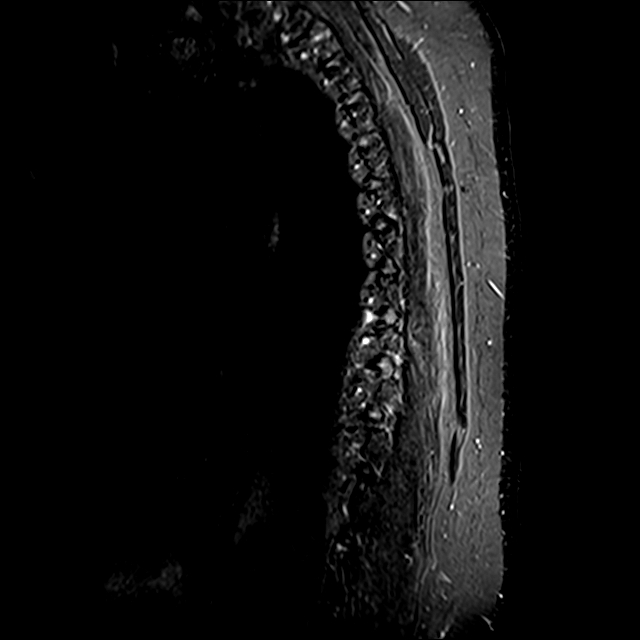

[Series 20: T2 · axial · 4.0mm · 0.59mm/px · z∈[-200,+43]mm · 8 of 39 slices shown (2 of 2)]
[im 1/39]
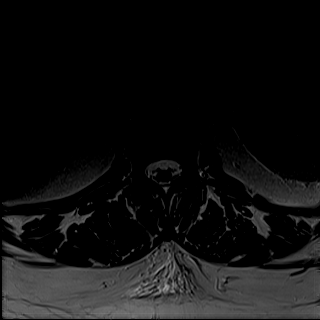
[im 6/39]
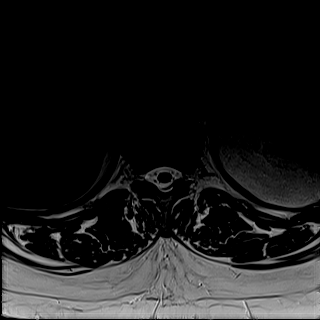
[im 12/39]
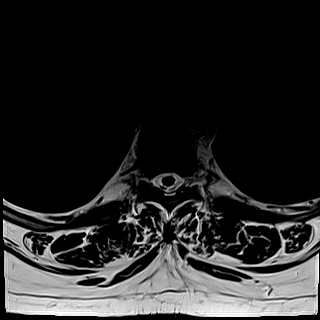
[im 18/39]
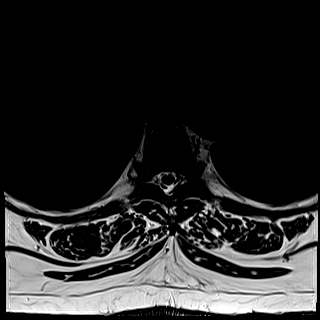
[im 21/39]
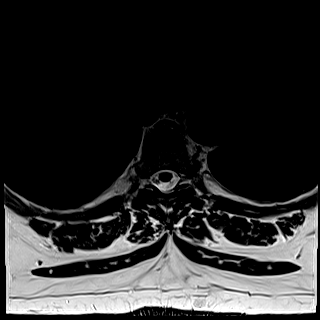
[im 27/39]
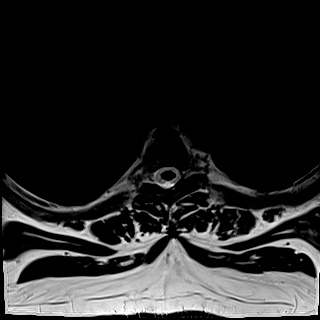
[im 33/39]
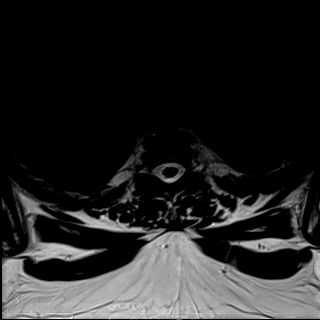
[im 39/39]
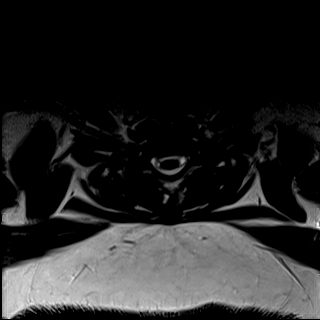

[Series 21: GRE · axial · 4.0mm · 0.37mm/px · z∈[-200,-158]mm · 2 of 39 slices shown]
[im 1/39]
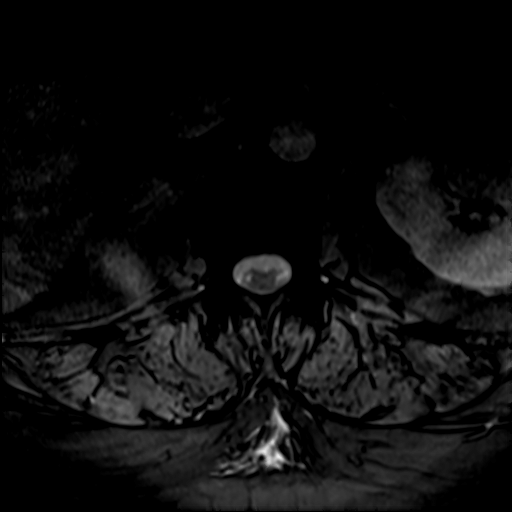
[im 6/39]
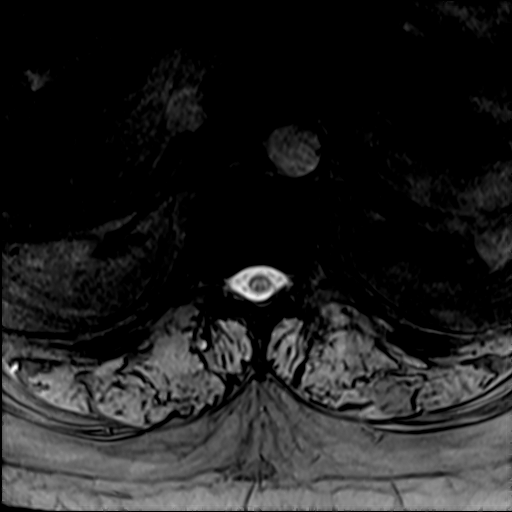

[30 of 48 positions shown; findings below may reference images not displayed]

FINDINGS: Alignment:  Normal

Vertebrae: Negative for fracture or mass.  No bone marrow edema.

Cord:  Normal signal and morphology.  No cord compression.

Paraspinal and other soft tissues: Negative

Disc levels:

Mild thoracic disc and facet degeneration. This is causing foraminal
narrowing bilaterally at T7-8 and T8-9. Small left-sided disc
protrusion T7-8.

No significant spinal stenosis. Small right-sided disc protrusion
T12-L1 without stenosis.
IMPRESSION: Mild thoracic degenerative changes. There is foraminal narrowing
bilaterally at T7-8 and T8-9 due to disc and facet degeneration.
There is a left-sided disc protrusion at T7-8

## 2021-04-29 ENCOUNTER — Other Ambulatory Visit: Payer: Self-pay

## 2021-04-29 ENCOUNTER — Ambulatory Visit
Admission: EM | Admit: 2021-04-29 | Discharge: 2021-04-29 | Disposition: A | Discharge status: Home / Self Care | Payer: Medicaid Other | Attending: Emergency Medicine | Admitting: Emergency Medicine

## 2021-04-29 ENCOUNTER — Encounter: Payer: Self-pay | Admitting: Emergency Medicine

## 2021-04-29 DIAGNOSIS — J069 Acute upper respiratory infection, unspecified: Secondary | ICD-10-CM

## 2021-04-29 DIAGNOSIS — J4531 Mild persistent asthma with (acute) exacerbation: Secondary | ICD-10-CM

## 2021-04-29 DIAGNOSIS — Z20822 Contact with and (suspected) exposure to covid-19: Secondary | ICD-10-CM | POA: Insufficient documentation

## 2021-04-29 MED ORDER — FLUTICASONE PROPIONATE 50 MCG/ACT NA SUSP: 2.0000 | Freq: Every day | NASAL | 0 refills | Status: AC

## 2021-04-29 MED ORDER — HYDROCOD POLST-CPM POLST ER 10-8 MG/5ML PO SUER: 5.0000 mL | Freq: Two times a day (BID) | ORAL | 0 refills | Status: AC | PRN

## 2021-04-29 MED ORDER — BENZONATATE 200 MG PO CAPS: 200.0000 mg | ORAL_CAPSULE | Freq: Three times a day (TID) | ORAL | 0 refills | Status: AC | PRN

## 2021-04-29 MED ORDER — NAPROXEN 500 MG PO TABS: 500.0000 mg | ORAL_TABLET | Freq: Two times a day (BID) | ORAL | 0 refills | Status: AC

## 2021-04-29 NOTE — ED Provider Notes (Signed)
HPI  SUBJECTIVE:  Brittany Mayer is a 45 y.o. female who presents with 4 days of feeling feverish, body aches, headaches, nasal congestion, rhinorrhea, postnasal drip, coughing, wheezing, chest soreness secondary to the coughing, shortness of breath.  States that she is unable to sleep at night secondary to the cough.  She states that her ears feel "stopped up" bilaterally and reports decreased hearing.  No ear pain.  Her son currently has identical symptoms.  His COVID PCR is negative.  She denies nausea, vomiting, diarrhea, abdominal pain.  No Calf pain, swelling, hemoptysis, surgery in the past 4 weeks, recent immobilization, history of DVT or PE.  No antipyretic in the past 6 hours.  She has been taking Mucinex, Aleve, albuterol inhaler with her spacer for shortness of breath.  No alleviating factors.  Symptoms worse with coughing.  She has a past medical history of asthma, diabetes, hypertension, BMI above 30.  No history of chronic kidney disease.  LMP: 6/8.  Denies the possibility of being pregnant.  PMD: None.    Past Medical History:  Diagnosis Date   Asthma    Diabetes ([place])    Hyperlipidemia    Hypertension     Past Surgical History:  Procedure Laterality Date   TUBAL LIGATION      Family History  Problem Relation Age of Onset   Hypertension Mother    Hyperlipidemia Mother    Asthma Mother    Heart disease Father     Social History   Tobacco Use   Smoking status: Every Day    Packs/day: 1.00    Pack years: 0.00    Types: Cigarettes   Smokeless tobacco: Never  Vaping Use   Vaping Use: Never used  Substance Use Topics   Alcohol use: Not Currently   Drug use: Never    No current facility-administered medications for this encounter.  Current Outpatient Medications:    beclomethasone (QVAR) 80 MCG/ACT inhaler, Inhale into the lungs., Disp: , Rfl:    benzonatate (TESSALON) 200 MG capsule, Take 1 capsule (200 mg total) by mouth 3 (three) times daily as needed for  cough., Disp: 30 capsule, Rfl: 0   cetirizine (ZYRTEC) 10 MG tablet, Take 10 mg by mouth daily., Disp: , Rfl:    chlorpheniramine-HYDROcodone (TUSSIONEX PENNKINETIC ER) 10-8 MG/5ML SUER, Take 5 mLs by mouth every 12 (twelve) hours as needed for cough., Disp: 60 mL, Rfl: 0   citalopram (CELEXA) 20 MG tablet, TAKE 1 TABLET (20 MG TOTAL) BY MOUTH ONCE DAILY., Disp: , Rfl:    esomeprazole (NEXIUM) 20 MG capsule, Take by mouth daily., Disp: , Rfl:    fluticasone (FLONASE) 50 MCG/ACT nasal spray, Place 2 sprays into both nostrils daily., Disp: 16 g, Rfl: 0   gabapentin (NEURONTIN) 300 MG capsule, Take 300 mg by mouth 3 (three) times daily., Disp: , Rfl:    JARDIANCE 25 MG TABS tablet, Take 25 mg by mouth daily., Disp: , Rfl:    LANTUS SOLOSTAR 100 UNIT/ML Solostar Pen, INJECT 120 UNITS SUBCUTANEOUSLY NIGHTLY, Disp: , Rfl:    LORazepam (ATIVAN) 0.5 MG tablet, TAKE 1 TABLET ( 0.5 MG TOTAL) BY MOUTH EVERY 8 (EIGHT) HOURS AS NEEDED FOR ANXIETY, Disp: , Rfl:    losartan (COZAAR) 100 MG tablet, Take 100 mg by mouth daily., Disp: , Rfl:    metFORMIN (GLUCOPHAGE) 1000 MG tablet, TAKE 1 TABLET (1,000 MG TOTAL) BY MOUTH 2 (TWO) TIMES DAILY WITH MEALS., Disp: , Rfl:    naproxen (NAPROSYN) 500  MG tablet, Take 1 tablet (500 mg total) by mouth 2 (two) times daily., Disp: 20 tablet, Rfl: 0   simvastatin (ZOCOR) 40 MG tablet, TAKE 1 TABLET (40 MG TOTAL) BY MOUTH NIGHTLY., Disp: , Rfl:    SUMAtriptan (IMITREX) 50 MG tablet, TAKE 1 TABLET (50 MG TOTAL) BY MOUTH ONCE AS NEEDED FOR MIGRAINE, Disp: , Rfl:    traZODone (DESYREL) 50 MG tablet, TAKE 1 TABLET BY MOUTH EVERY DAY AT NIGHT, Disp: , Rfl:    VICTOZA 18 MG/3ML SOPN, INJECT 1.8 MG UNDER THE SKIN ONCE DAILY, Disp: , Rfl:    cyclobenzaprine (FLEXERIL) 10 MG tablet, Take 1 tablet (10 mg total) by mouth at bedtime., Disp: 30 tablet, Rfl: 0  No Known Allergies   ROS  As noted in HPI.   Physical Exam  BP (!) 143/81 (BP Location: Left Arm)   Pulse (!) 101   Temp  98.4 F (36.9 C) (Oral)   Resp 16   Ht 5' 5"  (1.651 m)   Wt (!) 149.4 kg   LMP 04/27/2021 (Exact Date)   SpO2 93%   BMI 54.81 kg/m   Constitutional: Well developed, well nourished, no acute distress Eyes:  EOMI, conjunctiva normal bilaterally HENT: Normocephalic, atraumatic,mucus membranes moist.  Positive nasal congestion.  Erythematous, swollen turbinates.  Positive mild maxillary, frontal sinus tenderness.  Unable to completely visualize oropharynx. Neck: No cervical lymphadenopathy Respiratory: Normal inspiratory effort, scattered expiratory wheezing Cardiovascular: Regular tachycardia, no murmurs rubs or gallops GI: nondistended skin: No rash, skin intact Musculoskeletal: Calves symmetric, nontender, no edema Neurologic: Alert & oriented x 3, no focal neuro deficits Psychiatric: Speech and behavior appropriate   ED Course   Medications - No data to display  Orders Placed This Encounter  Procedures   SARS CORONAVIRUS 2 (TAT 6-24 HRS) Nasopharyngeal Nasopharyngeal Swab    Standing Status:   Standing    Number of Occurrences:   1    Order Specific Question:   Is this test for diagnosis or screening    Answer:   Diagnosis of ill patient    Order Specific Question:   Symptomatic for COVID-19 as defined by CDC    Answer:   Yes    Order Specific Question:   Date of Symptom Onset    Answer:   04/22/2021    Order Specific Question:   Hospitalized for COVID-19    Answer:   No    Order Specific Question:   Admitted to ICU for COVID-19    Answer:   No    Order Specific Question:   Previously tested for COVID-19    Answer:   No    Order Specific Question:   Resident in a congregate (group) care setting    Answer:   No    Order Specific Question:   Employed in healthcare setting    Answer:   No    Order Specific Question:   Pregnant    Answer:   No    Order Specific Question:   Has patient completed COVID vaccination(s) (2 doses of Pfizer/Moderna 1 dose of [employer])     Answer:   No    Results for orders placed or performed during the hospital encounter of 04/29/21 (from the past 24 hour(s))  SARS CORONAVIRUS 2 (TAT 6-24 HRS) Nasopharyngeal Nasopharyngeal Swab     Status: None   Collection Time: 04/29/21 10:02 AM   Specimen: Nasopharyngeal Swab  Result Value Ref Range   SARS Coronavirus 2 NEGATIVE NEGATIVE  No results found.  ED Clinical Impression  1. Upper respiratory tract infection, unspecified type   2. Mild persistent asthma with acute exacerbation   3. Encounter for laboratory testing for COVID-19 virus      ED Assessment/Plan  Patient with an acute illness with systemic effects given the tachycardia.  Patient with a URI, suspect COVID.  She will be a candidate for Paxlovid due to diabetes, hypertension, asthma, BMI above 30.  Her most recent GFR was 90 on 01/21/2021 found on care everywhere.  Do not need to repeat lab work.  Will send home with scheduled albuterol with her spacer for the next 4 days, Tessalon, Tussionex, Flonase, saline nasal irrigation, Aleve 500 mg with Tylenol, continue Mucinex.  She is to buy a pulse oximeter and go to the ED if it gets consistently below 90%.  Hesitant to prescribe steroids for the asthma because she is a diabetic.  Patient does not meet PERC criteria due to tachycardia, however she does have wheezing on exam, so I suspect that the mild hypoxia is from bronchoconstriction/asthma exacerbation rather than a PE.   Narcotic database reviewed for this patient, and feel that the risk/benefit ratio today is favorable for proceeding with a prescription for controlled substance.  Patient had a tramadol prescription in 5/22.  Discussed with patient to not take tramadol if she was taking the Tussionex.  COVID-negative.  Patient with URI.  Supportive treatment.  Plan as above.  Discussed labs, MDM, treatment plan, and plan for follow-up with patient. Discussed sn/sx that should prompt return to the ED. patient  agrees with plan.   Meds ordered this encounter  Medications   benzonatate (TESSALON) 200 MG capsule    Sig: Take 1 capsule (200 mg total) by mouth 3 (three) times daily as needed for cough.    Dispense:  30 capsule    Refill:  0   chlorpheniramine-HYDROcodone (TUSSIONEX PENNKINETIC ER) 10-8 MG/5ML SUER    Sig: Take 5 mLs by mouth every 12 (twelve) hours as needed for cough.    Dispense:  60 mL    Refill:  0   fluticasone (FLONASE) 50 MCG/ACT nasal spray    Sig: Place 2 sprays into both nostrils daily.    Dispense:  16 g    Refill:  0   naproxen (NAPROSYN) 500 MG tablet    Sig: Take 1 tablet (500 mg total) by mouth 2 (two) times daily.    Dispense:  20 tablet    Refill:  0      *This clinic note was created using [employer]. Therefore, there may be occasional mistakes despite careful proofreading.  ?    Melynda Ripple, MD 04/30/21 [phone]

## 2021-04-29 NOTE — Discharge Instructions (Addendum)
2 puffs from your albuterol inhaler every 4 hours for 2 days, then every 6 hours for 2 days, then as needed.  Make sure you use your spacer.  Tessalon for the cough occurring today, Tussionex for the cough at night.  Do not take tramadol at all if you are taking Tussionex.  Flonase, saline nasal irrigation with a Milta Deiters Med rinse and distilled water as often as you want, continue Mucinex.  500 mg of Aleve with 1000 mg of Tylenol twice a day.  May take an additional 1000 mg of Tylenol 2 more times a day.  Buy a pulse oximeter and go to the emergency department if it gets consistently below 90%.  We will contact you if your COVID is positive and will call in a prescription of paxlovid.

## 2021-04-29 NOTE — ED Triage Notes (Signed)
Patient c/o cough, chest congestion and SOB that started a week ago.  Patient denies fevers.  Patient reports chills.  Patient also reports pressure in her ears.  Patient did a home covid test on Wed and was negative.

## 2021-04-30 LAB — SARS CORONAVIRUS 2 (TAT 6-24 HRS): SARS Coronavirus 2: NEGATIVE

## 2021-05-02 ENCOUNTER — Telehealth: Payer: Self-pay | Admitting: Family Medicine

## 2021-05-02 NOTE — Telephone Encounter (Signed)
Patient calling requesting medication regarding her symptoms. I advised continued use of medication recently prescribed.  [facility]

## 2021-07-09 LAB — COLOGUARD: COLOGUARD: NEGATIVE

## 2022-05-19 ENCOUNTER — Other Ambulatory Visit: Payer: Self-pay | Admitting: Family Medicine

## 2022-05-19 DIAGNOSIS — K760 Fatty (change of) liver, not elsewhere classified: Secondary | ICD-10-CM

## 2022-06-05 ENCOUNTER — Ambulatory Visit
Admission: RE | Admit: 2022-06-05 | Discharge: 2022-06-05 | Disposition: A | Discharge status: Home / Self Care | Payer: Medicaid Other | Source: Ambulatory Visit | Attending: Family Medicine | Admitting: Family Medicine

## 2022-06-05 ENCOUNTER — Ambulatory Visit
Admission: RE | Admit: 2022-06-05 | Discharge: 2022-06-05 | Disposition: A | Discharge status: Home / Self Care | Payer: Medicaid Other | Source: Ambulatory Visit

## 2022-06-05 DIAGNOSIS — K760 Fatty (change of) liver, not elsewhere classified: Secondary | ICD-10-CM | POA: Diagnosis present

## 2023-04-27 ENCOUNTER — Other Ambulatory Visit: Payer: Self-pay | Admitting: Orthopedic Surgery

## 2023-04-27 DIAGNOSIS — M5136 Other intervertebral disc degeneration, lumbar region: Secondary | ICD-10-CM

## 2023-04-27 DIAGNOSIS — M5440 Lumbago with sciatica, unspecified side: Secondary | ICD-10-CM

## 2023-05-01 ENCOUNTER — Encounter: Payer: Self-pay | Admitting: Orthopedic Surgery

## 2023-05-05 ENCOUNTER — Other Ambulatory Visit: Payer: Medicaid Other

## 2024-06-02 ENCOUNTER — Ambulatory Visit (INDEPENDENT_AMBULATORY_CARE_PROVIDER_SITE_OTHER)

## 2024-06-02 ENCOUNTER — Ambulatory Visit: Admission: EM | Admit: 2024-06-02 | Discharge: 2024-06-02 | Disposition: A | Discharge status: Home / Self Care

## 2024-06-02 ENCOUNTER — Encounter: Payer: Self-pay | Admitting: Emergency Medicine

## 2024-06-02 DIAGNOSIS — S8002XA Contusion of left knee, initial encounter: Secondary | ICD-10-CM

## 2024-06-02 DIAGNOSIS — M25562 Pain in left knee: Secondary | ICD-10-CM | POA: Diagnosis not present

## 2024-06-02 NOTE — ED Triage Notes (Signed)
 Pt fell 2 weeks ago and landed on her left knee. She has pain, swelling and the left side of her knee feels numb.

## 2024-06-02 NOTE — ED Provider Notes (Signed)
 [facility]  Note:  This document was prepared using Conservation officer, historic buildings and may include unintentional dictation errors.  MRN: [id number] DOB: Feb 22, 1976  Subjective:   Brittany Mayer is a 48 y.o. female presenting for a fall 2 weeks ago landing on her left knee.  Patient reports currently swelling, pain, bruising and swollen area feels numb.  Patient denies any past trauma or knee injury.  Has been taking naproxen  500 mg twice daily for pain and inflammation with minimal improvement.  Patient reports increased pain with ambulation and standing for long periods of time.  No current facility-administered medications for this encounter.  Current Outpatient Medications:    beclomethasone (QVAR) 80 MCG/ACT inhaler, Inhale into the lungs., Disp: , Rfl:    benzonatate  (TESSALON ) 200 MG capsule, Take 1 capsule (200 mg total) by mouth 3 (three) times daily as needed for cough., Disp: 30 capsule, Rfl: 0   cetirizine (ZYRTEC) 10 MG tablet, Take 10 mg by mouth daily., Disp: , Rfl:    chlorpheniramine-HYDROcodone (TUSSIONEX PENNKINETIC ER) 10-8 MG/5ML SUER, Take 5 mLs by mouth every 12 (twelve) hours as needed for cough., Disp: 60 mL, Rfl: 0   citalopram (CELEXA) 20 MG tablet, TAKE 1 TABLET (20 MG TOTAL) BY MOUTH ONCE DAILY., Disp: , Rfl:    cyclobenzaprine  (FLEXERIL ) 10 MG tablet, Take 1 tablet (10 mg total) by mouth at bedtime., Disp: 30 tablet, Rfl: 0   esomeprazole (NEXIUM) 20 MG capsule, Take by mouth daily., Disp: , Rfl:    fluticasone  (FLONASE ) 50 MCG/ACT nasal spray, Place 2 sprays into both nostrils daily., Disp: 16 g, Rfl: 0   gabapentin (NEURONTIN) 300 MG capsule, Take 300 mg by mouth 3 (three) times daily., Disp: , Rfl:    JARDIANCE 25 MG TABS tablet, Take 25 mg by mouth daily., Disp: , Rfl:    LANTUS SOLOSTAR 100 UNIT/ML Solostar Pen, INJECT 120 UNITS SUBCUTANEOUSLY NIGHTLY, Disp: , Rfl:    LORazepam (ATIVAN) 0.5 MG tablet, TAKE 1 TABLET ( 0.5 MG TOTAL) BY MOUTH  EVERY 8 (EIGHT) HOURS AS NEEDED FOR ANXIETY, Disp: , Rfl:    losartan (COZAAR) 100 MG tablet, Take 100 mg by mouth daily., Disp: , Rfl:    metFORMIN (GLUCOPHAGE) 1000 MG tablet, TAKE 1 TABLET (1,000 MG TOTAL) BY MOUTH 2 (TWO) TIMES DAILY WITH MEALS., Disp: , Rfl:    naproxen  (NAPROSYN ) 500 MG tablet, Take 1 tablet (500 mg total) by mouth 2 (two) times daily., Disp: 20 tablet, Rfl: 0   OTEZLA 30 MG TABS, Take 1 tablet by mouth 2 (two) times daily., Disp: , Rfl:    simvastatin (ZOCOR) 40 MG tablet, TAKE 1 TABLET (40 MG TOTAL) BY MOUTH NIGHTLY., Disp: , Rfl:    SUMAtriptan (IMITREX) 50 MG tablet, TAKE 1 TABLET (50 MG TOTAL) BY MOUTH ONCE AS NEEDED FOR MIGRAINE, Disp: , Rfl:    traZODone (DESYREL) 50 MG tablet, TAKE 1 TABLET BY MOUTH EVERY DAY AT NIGHT, Disp: , Rfl:    VICTOZA 18 MG/3ML SOPN, INJECT 1.8 MG UNDER THE SKIN ONCE DAILY, Disp: , Rfl:    Allergies  Allergen Reactions   Lisinopril Cough   Propoxyphene     Hives   Sertraline Other (See Comments)    Made her tried   Varenicline Other (See Comments)    Made her feel bad - down    Past Medical History:  Diagnosis Date   Asthma    Diabetes ([facility])    Hyperlipidemia    Hypertension  Past Surgical History:  Procedure Laterality Date   TUBAL LIGATION      Family History  Problem Relation Age of Onset   Hypertension Mother    Hyperlipidemia Mother    Asthma Mother    Heart disease Father     Social History   Tobacco Use   Smoking status: Former    Current packs/day: 1.00    Types: Cigarettes   Smokeless tobacco: Never  Vaping Use   Vaping status: Never Used  Substance Use Topics   Alcohol use: Not Currently   Drug use: Never    ROS Refer to HPI for ROS details.  Objective:   Vitals: BP (!) 155/79 (BP Location: Right Arm)   Pulse 84   Temp 98.4 F (36.9 C) (Oral)   Resp 16   LMP 04/27/2021 (Exact Date)   SpO2 96%   Physical Exam Vitals and nursing note reviewed.  Constitutional:      General: She  is not in acute distress.    Appearance: She is well-developed. She is not ill-appearing or toxic-appearing.  HENT:     Head: Normocephalic and atraumatic.  Cardiovascular:     Rate and Rhythm: Normal rate.  Pulmonary:     Effort: Pulmonary effort is normal. No respiratory distress.  Musculoskeletal:     Right knee: Swelling and bony tenderness present. No deformity, effusion or erythema. Decreased range of motion. Tenderness present.  Skin:    General: Skin is warm and dry.  Neurological:     General: No focal deficit present.     Mental Status: She is alert and oriented to person, place, and time.  Psychiatric:        Mood and Affect: Mood normal.        Behavior: Behavior normal.     Procedures  No results found for this or any previous visit (from the past 24 hours).  Assessment and Plan :     Discharge Instructions       1. Contusion of left knee, initial encounter (Primary) - DG Knee Complete 4 Views Left x-ray performed in UC shows no acute fracture, mild soft tissue swelling, tricompartmental degenerative changes. - Apply ace wrap to left knee for compression and immobilization. - Continue using daily naproxen  500 mg twice daily as directed for pain and inflammation - Apply ice 2-3 times a day for 10 to 15 minutes at a time directly to the area of discomfort to help with inflammation and pain. - Follow-up Ortho as scheduled for further evaluation and management      Sheryl Towell B Kaityln Kallstrom   Arrow Tomko B, NP 06/02/24 1714

## 2024-06-02 NOTE — Discharge Instructions (Addendum)
  1. Contusion of left knee, initial encounter (Primary) - DG Knee Complete 4 Views Left x-ray performed in UC shows no acute fracture, mild soft tissue swelling, tricompartmental degenerative changes. - Apply ace wrap to left knee for compression and immobilization. - Continue using daily naproxen  500 mg twice daily as directed for pain and inflammation - Apply ice 2-3 times a day for 10 to 15 minutes at a time directly to the area of discomfort to help with inflammation and pain. - Follow-up Ortho as scheduled for further evaluation and management
